# Patient Record
Sex: Male | Born: 1960 | Race: White | Hispanic: No | Marital: Married | Smoking: Never smoker
Health system: Southern US, Community
[De-identification: ages and names within clinical notes are randomized; demographics above are authoritative.]

## PROBLEM LIST (undated history)

## (undated) DIAGNOSIS — I469 Cardiac arrest, cause unspecified: Secondary | ICD-10-CM

## (undated) DIAGNOSIS — G43909 Migraine, unspecified, not intractable, without status migrainosus: Secondary | ICD-10-CM

## (undated) DIAGNOSIS — I10 Essential (primary) hypertension: Secondary | ICD-10-CM

## (undated) DIAGNOSIS — E119 Type 2 diabetes mellitus without complications: Secondary | ICD-10-CM

---

## 2004-01-10 ENCOUNTER — Other Ambulatory Visit: Payer: Self-pay

## 2004-01-11 ENCOUNTER — Other Ambulatory Visit: Payer: Self-pay

## 2004-01-12 ENCOUNTER — Other Ambulatory Visit: Payer: Self-pay

## 2004-01-13 ENCOUNTER — Other Ambulatory Visit: Payer: Self-pay

## 2004-07-20 ENCOUNTER — Inpatient Hospital Stay: Payer: Self-pay | Admitting: Internal Medicine

## 2006-06-08 ENCOUNTER — Emergency Department: Payer: Self-pay | Admitting: Emergency Medicine

## 2006-06-08 ENCOUNTER — Other Ambulatory Visit: Payer: Self-pay

## 2006-06-09 ENCOUNTER — Ambulatory Visit: Payer: Self-pay | Admitting: Emergency Medicine

## 2007-02-07 ENCOUNTER — Emergency Department: Payer: Self-pay | Admitting: Emergency Medicine

## 2008-10-08 ENCOUNTER — Emergency Department: Payer: Self-pay | Admitting: Emergency Medicine

## 2009-10-28 ENCOUNTER — Emergency Department: Payer: Self-pay | Admitting: Emergency Medicine

## 2010-11-12 ENCOUNTER — Emergency Department: Payer: Self-pay | Admitting: Emergency Medicine

## 2011-04-08 ENCOUNTER — Emergency Department: Payer: Self-pay | Admitting: *Deleted

## 2016-03-04 ENCOUNTER — Encounter: Payer: Self-pay | Admitting: Emergency Medicine

## 2016-03-04 ENCOUNTER — Emergency Department
Admission: EM | Admit: 2016-03-04 | Discharge: 2016-03-04 | Disposition: A | Discharge status: Home / Self Care | Payer: Medicare Other | Attending: Emergency Medicine | Admitting: Emergency Medicine

## 2016-03-04 DIAGNOSIS — R739 Hyperglycemia, unspecified: Secondary | ICD-10-CM

## 2016-03-04 DIAGNOSIS — I10 Essential (primary) hypertension: Secondary | ICD-10-CM | POA: Insufficient documentation

## 2016-03-04 DIAGNOSIS — Z79899 Other long term (current) drug therapy: Secondary | ICD-10-CM | POA: Insufficient documentation

## 2016-03-04 DIAGNOSIS — E1165 Type 2 diabetes mellitus with hyperglycemia: Secondary | ICD-10-CM | POA: Insufficient documentation

## 2016-03-04 DIAGNOSIS — Z7982 Long term (current) use of aspirin: Secondary | ICD-10-CM | POA: Diagnosis not present

## 2016-03-04 DIAGNOSIS — Z7984 Long term (current) use of oral hypoglycemic drugs: Secondary | ICD-10-CM | POA: Diagnosis not present

## 2016-03-04 DIAGNOSIS — Z791 Long term (current) use of non-steroidal anti-inflammatories (NSAID): Secondary | ICD-10-CM | POA: Diagnosis not present

## 2016-03-04 DIAGNOSIS — R55 Syncope and collapse: Secondary | ICD-10-CM | POA: Diagnosis not present

## 2016-03-04 HISTORY — DX: Type 2 diabetes mellitus without complications: E11.9

## 2016-03-04 HISTORY — DX: Essential (primary) hypertension: I10

## 2016-03-04 HISTORY — DX: Cardiac arrest, cause unspecified: I46.9

## 2016-03-04 HISTORY — DX: Migraine, unspecified, not intractable, without status migrainosus: G43.909

## 2016-03-04 LAB — BLOOD GAS, VENOUS
ACID-BASE EXCESS: 0.2 mmol/L (ref 0.0–2.0)
Bicarbonate: 24.7 mmol/L (ref 20.0–28.0)
O2 SAT: 92.9 %
PATIENT TEMPERATURE: 37
PCO2 VEN: 39 mmHg — AB (ref 44.0–60.0)
pH, Ven: 7.41 (ref 7.250–7.430)
pO2, Ven: 66 mmHg — ABNORMAL HIGH (ref 32.0–45.0)

## 2016-03-04 LAB — COMPREHENSIVE METABOLIC PANEL
ALT: 36 U/L (ref 17–63)
ANION GAP: 10 (ref 5–15)
AST: 37 U/L (ref 15–41)
Albumin: 3.8 g/dL (ref 3.5–5.0)
Alkaline Phosphatase: 77 U/L (ref 38–126)
BILIRUBIN TOTAL: 0.5 mg/dL (ref 0.3–1.2)
BUN: 11 mg/dL (ref 6–20)
CHLORIDE: 98 mmol/L — AB (ref 101–111)
CO2: 23 mmol/L (ref 22–32)
Calcium: 8.7 mg/dL — ABNORMAL LOW (ref 8.9–10.3)
Creatinine, Ser: 0.85 mg/dL (ref 0.61–1.24)
GFR calc Af Amer: >60 mL/min (ref >60)
Glucose, Bld: 536 mg/dL (ref 65–99)
POTASSIUM: 3.8 mmol/L (ref 3.5–5.1)
Sodium: 131 mmol/L — ABNORMAL LOW (ref 135–145)
TOTAL PROTEIN: 7.7 g/dL (ref 6.5–8.1)

## 2016-03-04 LAB — GLUCOSE, CAPILLARY
GLUCOSE-CAPILLARY: 525 mg/dL — AB (ref 65–99)
Glucose-Capillary: 498 mg/dL — ABNORMAL HIGH (ref 65–99)
Glucose-Capillary: 261 mg/dL — ABNORMAL HIGH (ref 65–99)

## 2016-03-04 LAB — CBC WITH DIFFERENTIAL/PLATELET
BASOS ABS: 0.1 10*3/uL (ref 0–0.1)
BASOS PCT: 1 %
EOS PCT: 1 %
Eosinophils Absolute: 0.1 10*3/uL (ref 0–0.7)
HEMATOCRIT: 44.2 % (ref 40.0–52.0)
Hemoglobin: 15.4 g/dL (ref 13.0–18.0)
LYMPHS PCT: 20 %
Lymphs Abs: 1.3 10*3/uL (ref 1.0–3.6)
MCH: 31.5 pg (ref 26.0–34.0)
MCHC: 34.8 g/dL (ref 32.0–36.0)
MCV: 90.4 fL (ref 80.0–100.0)
MONO ABS: 0.6 10*3/uL (ref 0.2–1.0)
MONOS PCT: 9 %
NEUTROS ABS: 4.5 10*3/uL (ref 1.4–6.5)
Neutrophils Relative %: 69 %
PLATELETS: 222 10*3/uL (ref 150–440)
RBC: 4.89 MIL/uL (ref 4.40–5.90)
RDW: 12.6 % (ref 11.5–14.5)
WBC: 6.5 10*3/uL (ref 3.8–10.6)

## 2016-03-04 LAB — URINALYSIS COMPLETE WITH MICROSCOPIC (ARMC ONLY)
BILIRUBIN URINE: NEGATIVE
Bacteria, UA: NONE SEEN
HGB URINE DIPSTICK: NEGATIVE
KETONES UR: NEGATIVE mg/dL
LEUKOCYTES UA: NEGATIVE
NITRITE: NEGATIVE
PH: 5 (ref 5.0–8.0)
Protein, ur: NEGATIVE mg/dL
SPECIFIC GRAVITY, URINE: 1.026 (ref 1.005–1.030)

## 2016-03-04 LAB — BETA-HYDROXYBUTYRIC ACID: Beta-Hydroxybutyric Acid: 0.15 mmol/L (ref 0.05–0.27)

## 2016-03-04 LAB — LACTIC ACID, PLASMA: LACTIC ACID, VENOUS: 1.9 mmol/L (ref 0.5–1.9)

## 2016-03-04 LAB — TROPONIN I

## 2016-03-04 MED ORDER — INSULIN ASPART 100 UNIT/ML ~~LOC~~ SOLN
10.0000 [IU] | Freq: Once | SUBCUTANEOUS | Status: AC
  Administered 2016-03-04: 10 [IU] via INTRAVENOUS
  Filled 2016-03-04: qty 10

## 2016-03-04 MED ORDER — SODIUM CHLORIDE 0.9 % IV SOLN
1000.0000 mL | Freq: Once | INTRAVENOUS | Status: AC
  Administered 2016-03-04: 1000 mL via INTRAVENOUS

## 2016-03-04 MED ORDER — KETOROLAC TROMETHAMINE 30 MG/ML IJ SOLN
30.0000 mg | Freq: Once | INTRAMUSCULAR | Status: AC
  Administered 2016-03-04: 30 mg via INTRAVENOUS
  Filled 2016-03-04: qty 1

## 2016-03-04 MED ORDER — ONDANSETRON HCL 4 MG/2ML IJ SOLN
4.0000 mg | Freq: Once | INTRAMUSCULAR | Status: AC
  Administered 2016-03-04: 4 mg via INTRAVENOUS
  Filled 2016-03-04: qty 2

## 2016-03-04 MED ORDER — INSULIN GLARGINE 100 UNIT/ML ~~LOC~~ SOLN
40.0000 [IU] | Freq: Once | SUBCUTANEOUS | Status: AC
Start: 2016-03-04 — End: 2016-03-04
  Administered 2016-03-04: 40 [IU] via SUBCUTANEOUS
  Filled 2016-03-04: qty 0.4

## 2016-03-04 MED ORDER — INSULIN ASPART 100 UNIT/ML ~~LOC~~ SOLN
10.0000 [IU] | Freq: Once | SUBCUTANEOUS | Status: AC
  Administered 2016-03-04: 10 [IU] via SUBCUTANEOUS
  Filled 2016-03-04: qty 10

## 2016-03-04 MED ORDER — INSULIN GLARGINE 100 UNIT/ML ~~LOC~~ SOLN: SUBCUTANEOUS | 3 refills | Status: AC

## 2016-03-04 MED ORDER — SODIUM CHLORIDE 0.9 % IV SOLN
Freq: Once | INTRAVENOUS | Status: AC
  Administered 2016-03-04: 12:00:00 via INTRAVENOUS

## 2016-03-04 NOTE — ED Triage Notes (Signed)
Pt arrived via EMS from home. Pt states he was taking his son to school and doesn't remember leaving. EMS states wife reports pt was not acting right at home. [facility] at [facility] for DKA and cardiac arrest.  Pt states he has minimal use of left arm.  EMS reports CBG was 503.  Pt is alert and talking.

## 2016-03-04 NOTE — ED Provider Notes (Signed)
[facility] Emergency Department Provider Note        Time seen: ----------------------------------------- 9:43 AM on 03/04/2016 -----------------------------------------    I have reviewed the triage vital signs and the nursing notes.   HISTORY  Chief Complaint Hyperglycemia    HPI Craig HAREWOOD is a 55 y.o. male who presents to ER being brought from home by EMS for near syncopal event. Patient states he was taking some school doesn't remember leaving. EMS states his wife reports she's not been acting correctly at home. Was recently [facility] for DKA and supposedly had cardiac arrest. Patient states his blood sugar has been markedly elevated, the lowest it has been is in the mid 200s. He arrives awake and alert, denies complaints just feels weak.   No past medical history on file.  There are no active problems to display for this patient.   No past surgical history on file.  Allergies Review of patient's allergies indicates not on file.  Social History Social History  Substance Use Topics  . Smoking status: Not on file  . Smokeless tobacco: Not on file  . Alcohol use Not on file    Review of Systems Constitutional: Negative for fever. Cardiovascular: Negative for chest pain. Respiratory: Negative for shortness of breath. Gastrointestinal: Negative for abdominal pain, vomiting and diarrhea. Genitourinary: Negative for dysuria. Musculoskeletal: Negative for back pain. Skin: Negative for rash. Neurological: Negative for headaches, Positive for weakness  10-point ROS otherwise negative.  ____________________________________________   PHYSICAL EXAM:  VITAL SIGNS: ED Triage Vitals  Enc Vitals Group     BP      Pulse      Resp      Temp      Temp src      SpO2      Weight      Height      Head Circumference      Peak Flow      Pain Score      Pain Loc      Pain Edu?      Excl. in GC?     Constitutional: Alert and oriented. No  acute distress Eyes: Conjunctivae are normal. PERRL. Normal extraocular movements. ENT   Head: Normocephalic and atraumatic.   Nose: No congestion/rhinnorhea.   Mouth/Throat: Mucous membranes are moist.   Neck: No stridor. Cardiovascular: Normal rate, regular rhythm. No murmurs, rubs, or gallops. Respiratory: Normal respiratory effort without tachypnea nor retractions. Breath sounds are clear and equal bilaterally. No wheezes/rales/rhonchi. Gastrointestinal: Soft and nontender. Normal bowel sounds Musculoskeletal: Nontender with normal range of motion in all extremities. No lower extremity tenderness nor edema. Neurologic:  Normal speech and language. No gross focal neurologic deficits are appreciated. 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 No rash noted. Psychiatric: Mood and affect are normal. Speech and behavior are normal.  ____________________________________________  EKG: Interpreted by me. Sinus rhythm with rate 88 bpm, normal PR interval, normal QRS width, normal QT interval. Normal axis, PVC, low voltage.  ____________________________________________  ED COURSE:  Pertinent labs & imaging results that were available during my care of the patient were reviewed by me and considered in my medical decision making (see chart for details). Clinical Course  Patient presents to ER with hyperglycemia. We will assess with basic labs, give IV fluid and reevaluate.  Procedures ____________________________________________   LABS (pertinent positives/negatives)  Labs Reviewed  COMPREHENSIVE METABOLIC PANEL - Abnormal; Notable for the  following:       Result Value   Sodium 131 (*)    Chloride 98 (*)    Glucose, Bld 536 (*)    Calcium 8.7 (*)    All other components within normal limits  URINALYSIS COMPLETEWITH MICROSCOPIC ([facility] ONLY) - Abnormal; Notable for the following:    Color, Urine STRAW (*)     APPearance CLEAR (*)    Glucose, UA >500 (*)    Squamous Epithelial / LPF 0-5 (*)    All other components within normal limits  BLOOD GAS, VENOUS - Abnormal; Notable for the following:    pCO2, Ven 39 (*)    pO2, Ven 66.0 (*)    All other components within normal limits  GLUCOSE, CAPILLARY - Abnormal; Notable for the following:    Glucose-Capillary 498 (*)    All other components within normal limits  GLUCOSE, CAPILLARY - Abnormal; Notable for the following:    Glucose-Capillary 525 (*)    All other components within normal limits  CBC WITH DIFFERENTIAL/PLATELET  LACTIC ACID, PLASMA  TROPONIN I  BETA-HYDROXYBUTYRIC ACID  HEMOGLOBIN A1C  CBG MONITORING, ED  CBG MONITORING, ED  ____________________________________________  FINAL ASSESSMENT AND PLAN  Hyperglycemia, Near syncope  Plan: Patient with labs and imaging as dictated above. Patient is hyperglycemic but not in DKA. I discussed with the [facility] endocrinology recommends 40 units of Lantus which we have started him on here. His next dose will be tomorrow night. He does have close outpatient follow-up with his doctor. He is stable for outpatient follow-up at this time. Also to note, he had a panic attack while in the room, this resolved with reassurance.   Emily FilbertWilliams, Clanton Emanuelson E, MD   Note: This dictation was prepared with Dragon dictation. Any transcriptional errors that result from this process are unintentional    Emily FilbertJonathan E Imanuel Pruiett, MD 03/04/16 1308

## 2016-03-04 NOTE — ED Notes (Signed)
The EKG was completed, signed by Dr. Derrill KayGoodman, and exported into the system.

## 2016-03-04 NOTE — ED Notes (Signed)
Family out to hallway asking for help that something is wrong with the patient.  Pt observed in bed shaking but not in a seizure. Pt is able to talk through it.  MD at bedside, explained to family and patient that he is having an anxiety attack and not a seizure. Pt was able to relax with reassurance by myself and the MD.  Pt given a warm blanket as well.

## 2016-03-05 LAB — HEMOGLOBIN A1C: HEMOGLOBIN A1C: 11.4 % — AB (ref 4.0–6.0)

## 2017-02-27 ENCOUNTER — Emergency Department
Admission: EM | Admit: 2017-02-27 | Discharge: 2017-02-27 | Disposition: A | Discharge status: Home / Self Care | Payer: Medicare Other | Attending: Emergency Medicine | Admitting: Emergency Medicine

## 2017-02-27 ENCOUNTER — Emergency Department: Payer: Medicare Other

## 2017-02-27 DIAGNOSIS — W010XXA Fall on same level from slipping, tripping and stumbling without subsequent striking against object, initial encounter: Secondary | ICD-10-CM | POA: Diagnosis not present

## 2017-02-27 DIAGNOSIS — Y999 Unspecified external cause status: Secondary | ICD-10-CM | POA: Diagnosis not present

## 2017-02-27 DIAGNOSIS — Z79899 Other long term (current) drug therapy: Secondary | ICD-10-CM | POA: Diagnosis not present

## 2017-02-27 DIAGNOSIS — Z794 Long term (current) use of insulin: Secondary | ICD-10-CM | POA: Insufficient documentation

## 2017-02-27 DIAGNOSIS — Y9301 Activity, walking, marching and hiking: Secondary | ICD-10-CM | POA: Insufficient documentation

## 2017-02-27 DIAGNOSIS — S60222A Contusion of left hand, initial encounter: Secondary | ICD-10-CM | POA: Diagnosis not present

## 2017-02-27 DIAGNOSIS — E119 Type 2 diabetes mellitus without complications: Secondary | ICD-10-CM | POA: Insufficient documentation

## 2017-02-27 DIAGNOSIS — Z7982 Long term (current) use of aspirin: Secondary | ICD-10-CM | POA: Insufficient documentation

## 2017-02-27 DIAGNOSIS — Y9289 Other specified places as the place of occurrence of the external cause: Secondary | ICD-10-CM | POA: Insufficient documentation

## 2017-02-27 DIAGNOSIS — S60922A Unspecified superficial injury of left hand, initial encounter: Secondary | ICD-10-CM | POA: Diagnosis present

## 2017-02-27 DIAGNOSIS — I10 Essential (primary) hypertension: Secondary | ICD-10-CM | POA: Diagnosis not present

## 2017-02-27 MED ORDER — HYDROCODONE-ACETAMINOPHEN 5-325 MG PO TABS: 1.0000 | ORAL_TABLET | Freq: Four times a day (QID) | ORAL | 0 refills | Status: DC | PRN

## 2017-02-27 NOTE — ED Triage Notes (Signed)
Pt states he tripped and fell today and is having left hand pain.

## 2017-02-27 NOTE — Discharge Instructions (Signed)
Follow-up with your doctor at [facility] if any continued problems or [facility]. Ice and elevate your hand today. Leave the wrap on for added support. Norco every 6 hours as needed for pain.

## 2017-02-27 NOTE — ED Provider Notes (Signed)
[facility] Emergency Department Provider Note  ____________________________________________   First MD Initiated Contact with Patient 02/27/17 1015     (approximate)  I have reviewed the triage vital signs and the nursing notes.   HISTORY  Chief Complaint Hand Pain   HPI Craig Sharp is a 56 y.o. male is here complaining of left hand pain. Patient states that he has vertigo and was walking across the yard when he fell injuring his hand. Patient has increased pain with any movement of his left thumb. Patient denies any head injury or loss of consciousness. He rates pain as an 8/10. He was able to drive himself to the emergency department.   Past Medical History:  Diagnosis Date  . Cardiac arrest (HCC)   . Diabetes mellitus without complication (HCC)   . Hypertension   . Migraine     There are no active problems to display for this patient.   History reviewed. No pertinent surgical history.  Prior to Admission medications   Medication Sig Start Date End Date Taking? Authorizing Provider  aspirin EC 81 MG tablet Take 81 mg by mouth daily.    [provider]  atorvastatin (LIPITOR) 40 MG tablet Take 40 mg by mouth at bedtime.    [provider]  gabapentin (NEURONTIN) 600 MG tablet Take 1,200 mg by mouth 3 (three) times daily.    [provider]  glipiZIDE (GLUCOTROL) 10 MG tablet Take 10 mg by mouth daily.    [provider]  HYDROcodone-acetaminophen (NORCO/VICODIN) 5-325 MG tablet Take 1 tablet by mouth every 6 (six) hours as needed for moderate pain. 02/27/17   Tommi Rumps, PA-C  ibuprofen (ADVIL,MOTRIN) 600 MG tablet Take 600 mg by mouth every 6 (six) hours as needed for moderate pain.     [provider]  insulin glargine (LANTUS) 100 UNIT/ML injection 40 units subcutaneous daily at bedtime 03/04/16 03/04/17  Emily Filbert, MD  linagliptin (TRADJENTA) 5 MG TABS tablet Take 5 mg by mouth  daily.    [provider]  lisinopril (PRINIVIL,ZESTRIL) 2.5 MG tablet Take 5 mg by mouth daily.    [provider]  metFORMIN (GLUCOPHAGE) 1000 MG tablet Take 1,000 mg by mouth 2 (two) times daily with a meal.    [provider]  mirabegron ER (MYRBETRIQ) 50 MG TB24 tablet Take 50 mg by mouth daily.    [provider]  omeprazole (PRILOSEC) 20 MG capsule Take 20 mg by mouth daily.    [provider]  promethazine (PHENERGAN) 25 MG tablet Take 25 mg by mouth every 8 (eight) hours as needed for nausea.     [provider]  sertraline (ZOLOFT) 100 MG tablet Take 200 mg by mouth daily.    [provider]  SUMAtriptan (IMITREX) 25 MG tablet Take 50 mg by mouth every 2 (two) hours as needed for migraine.     [provider]  tamsulosin (FLOMAX) 0.4 MG CAPS capsule Take 0.4 mg by mouth daily.    [provider]  traMADol (ULTRAM) 50 MG tablet Take 50 mg by mouth every 8 (eight) hours as needed for moderate pain or severe pain.     [provider]  traZODone (DESYREL) 50 MG tablet Take 25 mg by mouth 3 (three) times daily as needed. For anxiety    [provider]  traZODone (DESYREL) 50 MG tablet Take 100 mg by mouth at bedtime as needed for sleep.    [provider]    Allergies Patient has no known allergies.  No family history on file.  Social History Social History  Substance Use Topics  . Smoking status: Never Smoker  . Smokeless tobacco: Never Used  . Alcohol use No    Review of Systems Constitutional: No fever/chills Eyes: No visual changes. ENT: No trauma Cardiovascular: Denies chest pain. Respiratory: Denies shortness of breath. Musculoskeletal: Positive for left hand pain. Skin: Negative for rash. Neurological: Negative for focal weakness or numbness.   ____________________________________________   PHYSICAL EXAM:  VITAL SIGNS: ED Triage Vitals  Enc Vitals Group       BP 02/27/17 0951 (!) 141/97     Pulse Rate 02/27/17 0951 92     Resp 02/27/17 0951 16     Temp 02/27/17 0951 98.6 F (37 C)     Temp Source 02/27/17 0951 Oral     SpO2 02/27/17 0951 99 %     Weight 02/27/17 0953 263 lb (119.3 kg)     Height 02/27/17 0953 5\' 11"  (1.803 m)     Head Circumference --      Peak Flow --      Pain Score 02/27/17 0939 8     Pain Loc --      Pain Edu? --      Excl. in GC? --    Constitutional: Alert and oriented. Well appearing and in no acute distress. Eyes: Conjunctivae are normal. Head: Atraumatic. Nose: No trauma Neck: No stridor.   Cardiovascular: Normal rate, regular rhythm. Grossly normal heart sounds.  Good peripheral circulation. Respiratory: Normal respiratory effort.  No retractions. Lungs CTAB. Musculoskeletal: Left hand no gross deformity is noted. There is some soft tissue swelling on the left hyperthenar eminence and increased pain with range of motion of left thumb. There is no abrasions or ecchymosis noted. Capillary refill is less than 3 seconds. Motor sensory is intact. Patient is able move wrist without any difficulty or pain. Neurologic:  Normal speech and language. No gross focal neurologic deficits are appreciated.  Skin:  Skin is warm, dry and intact.  Psychiatric: Mood and affect are normal. Speech and behavior are normal.  ____________________________________________   LABS (all labs ordered are listed, but only abnormal results are displayed)  Labs Reviewed - No data to display  RADIOLOGY  Dg Hand Complete Left  Result Date: 02/27/2017 CLINICAL DATA:  Left hand injury.  Pain. EXAM: LEFT HAND - COMPLETE 3+ VIEW COMPARISON:  None. FINDINGS: There is no evidence of fracture or dislocation. There is no evidence of arthropathy or other focal bone abnormality. The soft tissues are unremarkable. There is peripheral vascular atherosclerotic disease. IMPRESSION: No acute osseous injury of the left hand. Electronically Signed   By:  Elige Ko   On: 02/27/2017 10:42    ____________________________________________   PROCEDURES  Procedure(s) performed: None  Procedures  Critical Care performed: No  ____________________________________________   INITIAL IMPRESSION / ASSESSMENT AND PLAN / ED COURSE  Pertinent labs & imaging results that were available during my care of the patient were reviewed by me and considered in my medical decision making (see chart for details).  Patient was instructed to ice and elevate his hand to reduce swelling and he was placed in a Jones wrap for a stress support and protection. He is given a prescription for Norco as needed for pain. He will follow-up with his PCP at [facility] if any continued problems or he can follow up with the walk-in clinic at  [facility].   ___________________________________________   FINAL CLINICAL IMPRESSION(S) / ED DIAGNOSES  Final diagnoses:  Contusion of left hand, initial encounter      NEW MEDICATIONS STARTED DURING THIS VISIT:  New Prescriptions   HYDROCODONE-ACETAMINOPHEN (NORCO/VICODIN) 5-325 MG TABLET    Take 1 tablet by mouth every 6 (six) hours as needed for moderate pain.     Note:  This document was prepared using Dragon voice recognition software and may include unintentional dictation errors.    Tommi Rumps, PA-C 02/27/17 1112    Minna Antis, MD 02/27/17 1353

## 2017-02-27 NOTE — ED Notes (Signed)
"  jones splint" and padding applied to L hand to cushion and immobilize L thumb.

## 2017-02-27 NOTE — ED Notes (Signed)
See triage note  States he fell  Having pain to left hand  Positive swelling and increased pain

## 2018-08-08 ENCOUNTER — Emergency Department: Payer: Medicare Other

## 2018-08-08 ENCOUNTER — Encounter: Payer: Self-pay | Admitting: Emergency Medicine

## 2018-08-08 ENCOUNTER — Other Ambulatory Visit: Payer: Self-pay

## 2018-08-08 ENCOUNTER — Emergency Department
Admission: EM | Admit: 2018-08-08 | Discharge: 2018-08-08 | Disposition: A | Discharge status: Home / Self Care | Payer: Medicare Other | Attending: Emergency Medicine | Admitting: Emergency Medicine

## 2018-08-08 DIAGNOSIS — Z79899 Other long term (current) drug therapy: Secondary | ICD-10-CM | POA: Insufficient documentation

## 2018-08-08 DIAGNOSIS — Z794 Long term (current) use of insulin: Secondary | ICD-10-CM | POA: Insufficient documentation

## 2018-08-08 DIAGNOSIS — E119 Type 2 diabetes mellitus without complications: Secondary | ICD-10-CM | POA: Insufficient documentation

## 2018-08-08 DIAGNOSIS — R110 Nausea: Secondary | ICD-10-CM | POA: Diagnosis not present

## 2018-08-08 DIAGNOSIS — I252 Old myocardial infarction: Secondary | ICD-10-CM | POA: Diagnosis not present

## 2018-08-08 DIAGNOSIS — Z7982 Long term (current) use of aspirin: Secondary | ICD-10-CM | POA: Diagnosis not present

## 2018-08-08 DIAGNOSIS — I10 Essential (primary) hypertension: Secondary | ICD-10-CM | POA: Insufficient documentation

## 2018-08-08 DIAGNOSIS — R1031 Right lower quadrant pain: Secondary | ICD-10-CM | POA: Insufficient documentation

## 2018-08-08 DIAGNOSIS — R1084 Generalized abdominal pain: Secondary | ICD-10-CM

## 2018-08-08 LAB — CBC WITH DIFFERENTIAL/PLATELET
Abs Immature Granulocytes: 0.02 10*3/uL (ref 0.00–0.07)
BASOS ABS: 0.1 10*3/uL (ref 0.0–0.1)
BASOS PCT: 1 %
EOS ABS: 0.7 10*3/uL — AB (ref 0.0–0.5)
Eosinophils Relative: 11 %
HCT: 45.8 % (ref 39.0–52.0)
Hemoglobin: 16.1 g/dL (ref 13.0–17.0)
IMMATURE GRANULOCYTES: 0 %
LYMPHS ABS: 1.1 10*3/uL (ref 0.7–4.0)
Lymphocytes Relative: 15 %
MCH: 31.3 pg (ref 26.0–34.0)
MCHC: 35.2 g/dL (ref 30.0–36.0)
MCV: 89.1 fL (ref 80.0–100.0)
Monocytes Absolute: 1 10*3/uL (ref 0.1–1.0)
Monocytes Relative: 14 %
NEUTROS PCT: 59 %
NRBC: 0 % (ref 0.0–0.2)
Neutro Abs: 4.1 10*3/uL (ref 1.7–7.7)
PLATELETS: 290 10*3/uL (ref 150–400)
RBC: 5.14 MIL/uL (ref 4.22–5.81)
RDW: 12.4 % (ref 11.5–15.5)
WBC: 6.9 10*3/uL (ref 4.0–10.5)

## 2018-08-08 LAB — COMPREHENSIVE METABOLIC PANEL
ALBUMIN: 4.3 g/dL (ref 3.5–5.0)
ALT: 33 U/L (ref 0–44)
AST: 28 U/L (ref 15–41)
Alkaline Phosphatase: 57 U/L (ref 38–126)
Anion gap: 11 (ref 5–15)
BUN: 7 mg/dL (ref 6–20)
CHLORIDE: 105 mmol/L (ref 98–111)
CO2: 20 mmol/L — ABNORMAL LOW (ref 22–32)
Calcium: 9.2 mg/dL (ref 8.9–10.3)
Creatinine, Ser: 0.87 mg/dL (ref 0.61–1.24)
GFR calc Af Amer: >60 mL/min (ref >60)
GFR calc non Af Amer: >60 mL/min (ref >60)
GLUCOSE: 152 mg/dL — AB (ref 70–99)
POTASSIUM: 3.7 mmol/L (ref 3.5–5.1)
SODIUM: 136 mmol/L (ref 135–145)
Total Bilirubin: 0.9 mg/dL (ref 0.3–1.2)
Total Protein: 8.2 g/dL — ABNORMAL HIGH (ref 6.5–8.1)

## 2018-08-08 LAB — LIPASE, BLOOD: Lipase: 30 U/L (ref 11–51)

## 2018-08-08 MED ORDER — IOHEXOL 350 MG/ML SOLN
100.0000 mL | Freq: Once | INTRAVENOUS | Status: AC | PRN
  Administered 2018-08-08: 100 mL via INTRAVENOUS

## 2018-08-08 MED ORDER — HALOPERIDOL LACTATE 5 MG/ML IJ SOLN
5.0000 mg | Freq: Once | INTRAMUSCULAR | Status: AC
  Administered 2018-08-08: 5 mg via INTRAVENOUS
  Filled 2018-08-08: qty 1

## 2018-08-08 MED ORDER — SODIUM CHLORIDE 0.9 % IV BOLUS
1000.0000 mL | Freq: Once | INTRAVENOUS | Status: AC
  Administered 2018-08-08: 1000 mL via INTRAVENOUS

## 2018-08-08 MED ORDER — ONDANSETRON HCL 4 MG/2ML IJ SOLN
4.0000 mg | Freq: Once | INTRAMUSCULAR | Status: AC
  Administered 2018-08-08: 4 mg via INTRAVENOUS
  Filled 2018-08-08: qty 2

## 2018-08-08 MED ORDER — MORPHINE SULFATE (PF) 4 MG/ML IV SOLN
4.0000 mg | Freq: Once | INTRAVENOUS | Status: AC
  Administered 2018-08-08: 4 mg via INTRAVENOUS
  Filled 2018-08-08: qty 1

## 2018-08-08 MED ORDER — DICYCLOMINE HCL 20 MG PO TABS: 20.0000 mg | ORAL_TABLET | Freq: Three times a day (TID) | ORAL | 0 refills | Status: AC | PRN

## 2018-08-08 NOTE — Discharge Instructions (Addendum)
Please seek medical attention for any high fevers, chest pain, shortness of breath, change in behavior, persistent vomiting, bloody stool or any other new or concerning symptoms.  

## 2018-08-08 NOTE — ED Triage Notes (Addendum)
Pt arrives to Ed via [facility]. Per EMS pt has had abd pain for the last couple of weeks. Abd Pain 10/10 RLQ radiates to R/lower back. Per EMS pt takes insulin at home, Pt states he has not taking his insulin today. EMA: CBG 143, 160/88; 92HR; pt was nauseous, EMS administered 4mg  of zofran in route. Upon arrival pt c/o of dizziness, nausea. Pt states severe pain after eating. Pt is actively  dry heaving.

## 2018-08-08 NOTE — ED Notes (Signed)
ED Provider at bedside. 

## 2018-08-08 NOTE — ED Provider Notes (Signed)
[facility] Emergency Department Provider Note  ____________________________________________   I have reviewed the triage vital signs and the nursing notes.   HISTORY  Chief Complaint Abdominal Pain   History limited by: Not Limited   HPI Craig Sharp is a 58 y.o. male who presents to the emergency department today because of concerns for abdominal pain.  Patient apparently has been battling abdominal pain for a few weeks.  Is located on the right side.  He states it is worse after he eats.  Today he had a sandwich and then the pain became severe.  Is located primarily in the right lower quadrant.  He has had some nausea with this.  He is also noticed that his stool is now more green in color.  He has not noticed any blood in his stool.  Patient denies any fevers.  There is strong family history of gallbladder disease.  Per medical record review patient has a history of cardiac arrest, DM, HTN.   Past Medical History:  Diagnosis Date  . Cardiac arrest (HCC)   . Diabetes mellitus without complication (HCC)   . Hypertension   . Migraine     There are no active problems to display for this patient.   History reviewed. No pertinent surgical history.  Prior to Admission medications   Medication Sig Start Date End Date Taking? Authorizing Provider  aspirin EC 81 MG tablet Take 81 mg by mouth daily.    [provider]  atorvastatin (LIPITOR) 40 MG tablet Take 40 mg by mouth at bedtime.    [provider]  gabapentin (NEURONTIN) 600 MG tablet Take 1,200 mg by mouth 3 (three) times daily.    [provider]  glipiZIDE (GLUCOTROL) 10 MG tablet Take 10 mg by mouth daily.    [provider]  HYDROcodone-acetaminophen (NORCO/VICODIN) 5-325 MG tablet Take 1 tablet by mouth every 6 (six) hours as needed for moderate pain. 02/27/17   Tommi Rumps, PA-C  ibuprofen (ADVIL,MOTRIN) 600 MG tablet Take 600 mg by mouth every 6 (six)  hours as needed for moderate pain.     [provider]  insulin glargine (LANTUS) 100 UNIT/ML injection 40 units subcutaneous daily at bedtime 03/04/16 03/04/17  Emily Filbert, MD  linagliptin (TRADJENTA) 5 MG TABS tablet Take 5 mg by mouth daily.    [provider]  lisinopril (PRINIVIL,ZESTRIL) 2.5 MG tablet Take 5 mg by mouth daily.    [provider]  metFORMIN (GLUCOPHAGE) 1000 MG tablet Take 1,000 mg by mouth 2 (two) times daily with a meal.    [provider]  mirabegron ER (MYRBETRIQ) 50 MG TB24 tablet Take 50 mg by mouth daily.    [provider]  omeprazole (PRILOSEC) 20 MG capsule Take 20 mg by mouth daily.    [provider]  promethazine (PHENERGAN) 25 MG tablet Take 25 mg by mouth every 8 (eight) hours as needed for nausea.     [provider]  sertraline (ZOLOFT) 100 MG tablet Take 200 mg by mouth daily.    [provider]  SUMAtriptan (IMITREX) 25 MG tablet Take 50 mg by mouth every 2 (two) hours as needed for migraine.     [provider]  tamsulosin (FLOMAX) 0.4 MG CAPS capsule Take 0.4 mg by mouth daily.    [provider]  traMADol (ULTRAM) 50 MG tablet Take 50 mg by mouth every 8 (eight) hours as needed for moderate pain or severe pain.  [provider]  traZODone (DESYREL) 50 MG tablet Take 25 mg by mouth 3 (three) times daily as needed. For anxiety    [provider]  traZODone (DESYREL) 50 MG tablet Take 100 mg by mouth at bedtime as needed for sleep.    [provider]    Allergies Patient has no known allergies.  History reviewed. No pertinent family history.  Social History Social History   Tobacco Use  . Smoking status: Never Smoker  . Smokeless tobacco: Never Used  Substance Use Topics  . Alcohol use: No  . Drug use: Not on file    Review of Systems Constitutional: No fever/chills Eyes: No visual changes. ENT: No sore  throat. Cardiovascular: Denies chest pain. Respiratory: Denies shortness of breath. Gastrointestinal: Positive for abdominal pain.  Genitourinary: Negative for dysuria. Musculoskeletal: Negative for back pain. Skin: Negative for rash. Neurological: Negative for headaches, focal weakness or numbness.  ____________________________________________   PHYSICAL EXAM:  VITAL SIGNS: ED Triage Vitals  Enc Vitals Group     BP 08/08/18 1856 (!) 150/101     Pulse Rate 08/08/18 1856 91     Resp 08/08/18 1856 20     Temp 08/08/18 1851 99.2 F (37.3 C)     Temp Source 08/08/18 1851 Oral     SpO2 08/08/18 1856 100 %     Weight 08/08/18 1852 260 lb (117.9 kg)     Height 08/08/18 1852 5\' 11"  (1.803 m)     Head Circumference --      Peak Flow --      Pain Score 08/08/18 1852 10    Constitutional: Alert and oriented.  Eyes: Conjunctivae are normal.  ENT      Head: Normocephalic and atraumatic.      Nose: No congestion/rhinnorhea.      Mouth/Throat: Mucous membranes are moist.      Neck: No stridor. Hematological/Lymphatic/Immunilogical: No cervical lymphadenopathy. Cardiovascular: Normal rate, regular rhythm.  No murmurs, rubs, or gallops.  Respiratory: Normal respiratory effort without tachypnea nor retractions. Breath sounds are clear and equal bilaterally. No wheezes/rales/rhonchi. Gastrointestinal: Soft and tender to palpation in the right lower quadrant.  Genitourinary: Deferred Musculoskeletal: Normal range of motion in all extremities. No lower extremity edema. Neurologic:  Normal speech and language. No gross focal neurologic deficits are appreciated.  Skin:  Skin is warm, dry and intact. No rash noted. Psychiatric: Mood and affect are normal. Speech and behavior are normal. Patient exhibits appropriate insight and judgment.  ____________________________________________    LABS (pertinent positives/negatives)  Lipase 30 CBC wbc 6.9, hgb 16.1, plt 290 CMP na 136, k 3.7, glu  152, cr 0.87  ____________________________________________   EKG  None  ____________________________________________    RADIOLOGY  CT abd/pel No acute abnormality  ____________________________________________   PROCEDURES  Procedures  ____________________________________________   INITIAL IMPRESSION / ASSESSMENT AND PLAN / ED COURSE  Pertinent labs & imaging results that were available during my care of the patient were reviewed by me and considered in my medical decision making (see chart for details).   Patient presented to the emergency department today because of concerns for abdominal pain.  He was tender in the right lower quadrant raising concern for possible appendicitis.  Also had some concerns for cholecystitis given the family history.  CT scan did not show any inflammation legs nor did it show any acute abnormality.  Blood work without leukocytosis or elevation of LFTs.  At this point unclear etiology although patient did feel better after  Haldol.  Will discharge with some Bentyl.  Discussed with patient portance of following up with primary care.  ____________________________________________   FINAL CLINICAL IMPRESSION(S) / ED DIAGNOSES  Final diagnoses:  Generalized abdominal pain     Note: This dictation was prepared with Dragon dictation. Any transcriptional errors that result from this process are unintentional     Phineas SemenGoodman, Daton Szilagyi, MD 08/08/18 2136

## 2019-05-04 ENCOUNTER — Emergency Department
Admission: EM | Admit: 2019-05-04 | Discharge: 2019-05-04 | Disposition: A | Discharge status: Home / Self Care | Payer: Medicare Other | Attending: Emergency Medicine | Admitting: Emergency Medicine

## 2019-05-04 ENCOUNTER — Emergency Department: Payer: Medicare Other

## 2019-05-04 ENCOUNTER — Other Ambulatory Visit: Payer: Self-pay

## 2019-05-04 ENCOUNTER — Encounter: Payer: Self-pay | Admitting: Emergency Medicine

## 2019-05-04 DIAGNOSIS — Z7982 Long term (current) use of aspirin: Secondary | ICD-10-CM | POA: Insufficient documentation

## 2019-05-04 DIAGNOSIS — I10 Essential (primary) hypertension: Secondary | ICD-10-CM | POA: Insufficient documentation

## 2019-05-04 DIAGNOSIS — E119 Type 2 diabetes mellitus without complications: Secondary | ICD-10-CM | POA: Insufficient documentation

## 2019-05-04 DIAGNOSIS — M25512 Pain in left shoulder: Secondary | ICD-10-CM | POA: Insufficient documentation

## 2019-05-04 DIAGNOSIS — W19XXXA Unspecified fall, initial encounter: Secondary | ICD-10-CM

## 2019-05-04 DIAGNOSIS — Z794 Long term (current) use of insulin: Secondary | ICD-10-CM | POA: Diagnosis not present

## 2019-05-04 DIAGNOSIS — Z79899 Other long term (current) drug therapy: Secondary | ICD-10-CM | POA: Diagnosis not present

## 2019-05-04 MED ORDER — FENTANYL CITRATE (PF) 100 MCG/2ML IJ SOLN
50.0000 ug | INTRAMUSCULAR | Status: DC | PRN
  Administered 2019-05-04: 50 ug via INTRAVENOUS
  Filled 2019-05-04: qty 2

## 2019-05-04 MED ORDER — LIDOCAINE 5 % EX PTCH: 1.0000 | MEDICATED_PATCH | Freq: Two times a day (BID) | CUTANEOUS | 0 refills | Status: AC

## 2019-05-04 MED ORDER — HYDROMORPHONE HCL 1 MG/ML IJ SOLN
1.0000 mg | Freq: Once | INTRAMUSCULAR | Status: AC
  Administered 2019-05-04: 1 mg via INTRAMUSCULAR

## 2019-05-04 MED ORDER — HYDROMORPHONE HCL 1 MG/ML IJ SOLN
1.0000 mg | Freq: Once | INTRAMUSCULAR | Status: DC
Start: 2019-05-04 — End: 2019-05-04
  Filled 2019-05-04: qty 1

## 2019-05-04 MED ORDER — LIDOCAINE 5 % EX PTCH
1.0000 | MEDICATED_PATCH | CUTANEOUS | Status: DC
  Administered 2019-05-04: 1 via TRANSDERMAL
  Filled 2019-05-04: qty 1

## 2019-05-04 NOTE — ED Triage Notes (Signed)
Pt had episodes of vertigo and lost balance falling with elbow hitting couch and his body kept going.  Seems to have a deformity to LUE but unsure if shoulder or humerus.  Severe pain.

## 2019-05-04 NOTE — ED Notes (Addendum)
First Nurse Note: Pt to ED s/p fall. Pt states that he was getting up off the couch and fell. Pt is c/o pain in his left shoulder. Pt states that he has vertigo.

## 2019-05-04 NOTE — ED Provider Notes (Signed)
[facility] Emergency Department Provider Note   ____________________________________________   First MD Initiated Contact with Patient 05/04/19 1030     (approximate)  I have reviewed the triage vital signs and the nursing notes.   HISTORY  Chief Complaint Shoulder Injury    HPI Craig Sharp is a 58 y.o. male patient presents with left shoulder and elbow pain secondary to a fall.  Patient state lost his balance getting up from a couch and fell striking the left elbow.  Patient points to mid humerus as a source of pain.  Patient denies loss of sensation but has decreased range of motion with extension and flexion of the left upper extremity.  Also decreased range of motion of the left shoulder for abduction.  Patient rates his pain as a 10/10.  Patient described the pain as "sharp".  Patient states transient relief with fentanyl x2.  Patient currently taking Suboxone.         Past Medical History:  Diagnosis Date  . Cardiac arrest ([place])   . Diabetes mellitus without complication ([place])   . Hypertension   . Migraine     There are no active problems to display for this patient.   History reviewed. No pertinent surgical history.  Prior to Admission medications   Medication Sig Start Date End Date Taking? Authorizing Provider  buprenorphine-naloxone (SUBOXONE) 8-2 mg SUBL SL tablet Place 1 tablet under the tongue daily.   Yes [provider]  aspirin EC 81 MG tablet Take 81 mg by mouth daily.    [provider]  atorvastatin (LIPITOR) 40 MG tablet Take 40 mg by mouth at bedtime.    [provider]  dicyclomine (BENTYL) 20 MG tablet Take 1 tablet (20 mg total) by mouth 3 (three) times daily as needed (abdominal pain). 08/08/18   Nance Pear, MD  gabapentin (NEURONTIN) 600 MG tablet Take 1,200 mg by mouth 3 (three) times daily.    [provider]  glipiZIDE (GLUCOTROL) 10 MG tablet Take 10 mg by mouth daily.     [provider]  ibuprofen (ADVIL,MOTRIN) 600 MG tablet Take 600 mg by mouth every 6 (six) hours as needed for moderate pain.     [provider]  insulin glargine (LANTUS) 100 UNIT/ML injection 40 units subcutaneous daily at bedtime 03/04/16 03/04/17  Earleen Newport, MD  lidocaine (LIDODERM) 5 % Place 1 patch onto the skin every 12 (twelve) hours. Remove & Discard patch within 12 hours or as directed by MD 05/04/19 05/03/20  Sable Feil, PA-C  linagliptin (TRADJENTA) 5 MG TABS tablet Take 5 mg by mouth daily.    [provider]  lisinopril (PRINIVIL,ZESTRIL) 2.5 MG tablet Take 5 mg by mouth daily.    [provider]  metFORMIN (GLUCOPHAGE) 1000 MG tablet Take 1,000 mg by mouth 2 (two) times daily with a meal.    [provider]  mirabegron ER (MYRBETRIQ) 50 MG TB24 tablet Take 50 mg by mouth daily.    [provider]  omeprazole (PRILOSEC) 20 MG capsule Take 20 mg by mouth daily.    [provider]  promethazine (PHENERGAN) 25 MG tablet Take 25 mg by mouth every 8 (eight) hours as needed for nausea.     [provider]  sertraline (ZOLOFT) 100 MG tablet Take 200 mg by mouth daily.    [provider]  SUMAtriptan (IMITREX) 25 MG tablet Take 50 mg by mouth every 2 (two) hours as needed  for migraine.     [provider]  tamsulosin (FLOMAX) 0.4 MG CAPS capsule Take 0.4 mg by mouth daily.    [provider]  traZODone (DESYREL) 50 MG tablet Take 25 mg by mouth 3 (three) times daily as needed. For anxiety    [provider]  traZODone (DESYREL) 50 MG tablet Take 100 mg by mouth at bedtime as needed for sleep.    [provider]    Allergies Patient has no known allergies.  History reviewed. No pertinent family history.  Social History Social History   Tobacco Use  . Smoking status: Never Smoker  . Smokeless tobacco: Never Used  Substance Use Topics  . Alcohol use: No  .  Drug use: Not on file    Review of Systems Constitutional: No fever/chills Eyes: No visual changes. ENT: No sore throat. Cardiovascular: Denies chest pain. Respiratory: Denies shortness of breath. Gastrointestinal: No abdominal pain.  No nausea, no vomiting.  No diarrhea.  No constipation. Genitourinary: Negative for dysuria. Musculoskeletal: Left shoulder and upper arm pain. Skin: Negative for rash. Neurological: Negative for headaches, focal weakness or numbness. Endocrine:  Diabetes and hypertension.   ____________________________________________   PHYSICAL EXAM:  VITAL SIGNS: ED Triage Vitals  Enc Vitals Group     BP 05/04/19 0907 (!) 141/87     Pulse Rate 05/04/19 0907 88     Resp 05/04/19 0907 18     Temp 05/04/19 0907 98.3 F (36.8 C)     Temp Source 05/04/19 0907 Oral     SpO2 05/04/19 0907 95 %     Weight 05/04/19 0910 285 lb (129.3 kg)     Height 05/04/19 0910 5\' 11"  (1.803 m)     Head Circumference --      Peak Flow --      Pain Score 05/04/19 0910 10     Pain Loc --      Pain Edu? --      Excl. in GC? --    Constitutional: Alert and oriented. Well appearing and in no acute distress. Cardiovascular: Normal rate, regular rhythm. Grossly normal heart sounds.  Good peripheral circulation. Respiratory: Normal respiratory effort.  No retractions. Lungs CTAB. Musculoskeletal: No lower extremity tenderness nor edema.  No joint effusions. Neurologic:  Normal speech and language. No gross focal neurologic deficits are appreciated. No gait instability. Skin:  Skin is warm, dry and intact. No rash noted. Psychiatric: Mood and affect are normal. Speech and behavior are normal.  ____________________________________________   LABS (all labs ordered are listed, but only abnormal results are displayed)  Labs Reviewed - No data to display ____________________________________________  EKG   ____________________________________________  RADIOLOGY  ED MD  interpretation:    Official radiology report(s): Dg Shoulder Left  Result Date: 05/04/2019 CLINICAL DATA:  Larey SeatFell against the edge of the couch this morning, hyper extending left shoulder. Now, unable to move/use left shoulder Pt refused axillary condition EXAM: LEFT SHOULDER - 2+ VIEW COMPARISON:  None. FINDINGS: No fracture or bone lesion. Glenohumeral joint appears widened, but normally aligned. [facility] with small marginal spurs consistent with mild osteoarthritis. Soft tissues are unremarkable. IMPRESSION: 1. No fracture or dislocation. 2. Widened glenohumeral joint space suggesting ligamentous injury. 3. Mild AC joint osteoarthritis. Electronically Signed   By: Amie Portlandavid  Ormond M.D.   On: 05/04/2019 10:19   Dg Humerus Left  Result Date: 05/04/2019 CLINICAL DATA:  Fall, pain EXAM: LEFT HUMERUS - 2+ VIEW COMPARISON:  None. FINDINGS: No  fracture or dislocation of the left humerus. The included shoulder, elbow, and left chest are unremarkable. Soft tissues are unremarkable. IMPRESSION: No fracture or dislocation of the left humerus. Electronically Signed   By: Lauralyn Primes M.D.   On: 05/04/2019 11:13    ____________________________________________   PROCEDURES  Procedure(s) performed (including Critical Care):  Procedures   ____________________________________________   INITIAL IMPRESSION / ASSESSMENT AND PLAN / ED COURSE  As part of my medical decision making, I reviewed the following data within the electronic MEDICAL RECORD NUMBER         Craig Sharp was evaluated in Emergency Department on 05/04/2019 for the symptoms described in the history of present illness. He was evaluated in the context of the global COVID-19 pandemic, which necessitated consideration that the patient might be at risk for infection with the SARS-CoV-2 virus that causes COVID-19. Institutional protocols and algorithms that pertain to the evaluation of patients at risk for COVID-19 are in a  state of rapid change based on information released by regulatory bodies including the CDC and federal and state organizations. These policies and algorithms were followed during the patient's care in the ED.  Patient presents with left shoulder pain secondary to a fall this morning.  Physical exam is remarkable for guarding at the [facility] joint.  There is also moderate guarding palpation the midshaft of the humerus.  Discussed x-ray finding patient presents with ligament injuries at the [facility] joint.  Patient placed in a sling and given a prescription for Lidoderm patches.  May continue previous medications.  Patient advised to follow orthopedic by calling for an appointment on Monday morning.   ____________________________________________   FINAL CLINICAL IMPRESSION(S) / ED DIAGNOSES  Final diagnoses:  Acute pain of left shoulder     ED Discharge Orders         Ordered    lidocaine (LIDODERM) 5 %  Every 12 hours     05/04/19 1110           Note:  This document was prepared using Dragon voice recognition software and may include unintentional dictation errors.    Joni Reining, PA-C 05/04/19 1119    Shaune Pollack, MD 05/04/19 2005

## 2019-05-04 NOTE — Discharge Instructions (Signed)
Wear sling and apply pain patches as directed.  Call orthopedic clinic in 2 days and tell you to follow-up in emergency room.  They will schedule point for definitive evaluation and treatment.

## 2019-10-22 IMAGING — CT CT ABD-PELV W/ CM
2 of 5 series · 17 of 46 positions shown, 19 images · IV contrast (APPLIED)
Comparison: 04/09/2011

CLINICAL DATA: Abdominal pain last couple weeks

EXAM:
CT ABDOMEN AND PELVIS WITH CONTRAST
TECHNIQUE: Multidetector CT imaging of the abdomen and pelvis was performed
using the standard protocol following bolus administration of
intravenous contrast.
CONTRAST:  100mL OMNIPAQUE IOHEXOL 350 MG/ML SOLN

[Series 2: routine abd/pel with · axial · 0.86mm/px · z∈[-378,+86]mm · 14 of 105 slices shown, 16 images]
[im 6/105  soft-tissue]
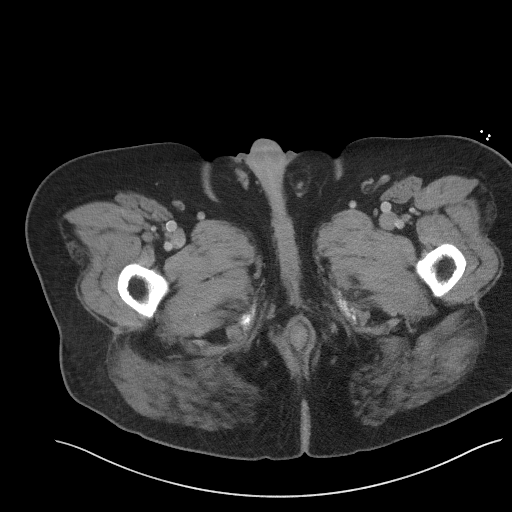
[im 6/105  bone]
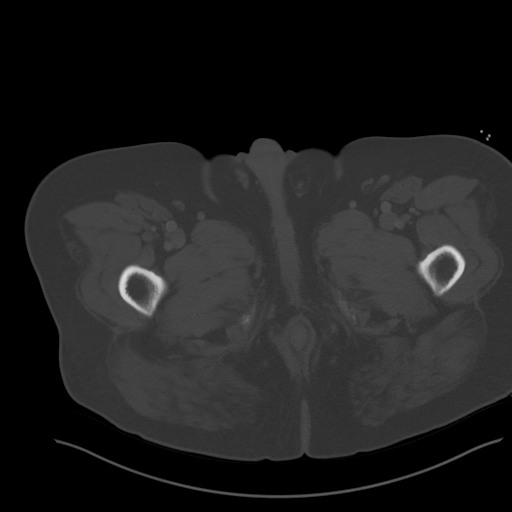
[im 11/105  soft-tissue]
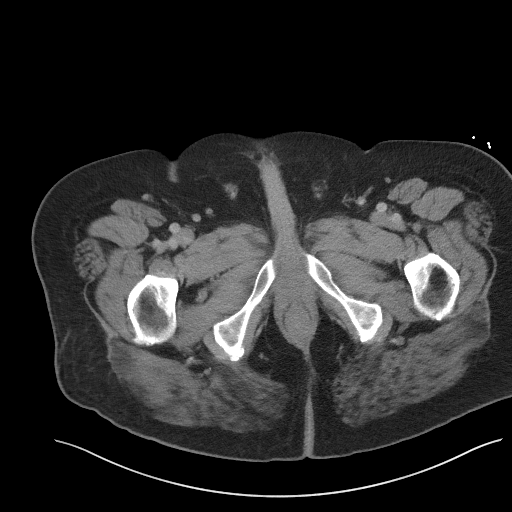
[im 22/105  soft-tissue]
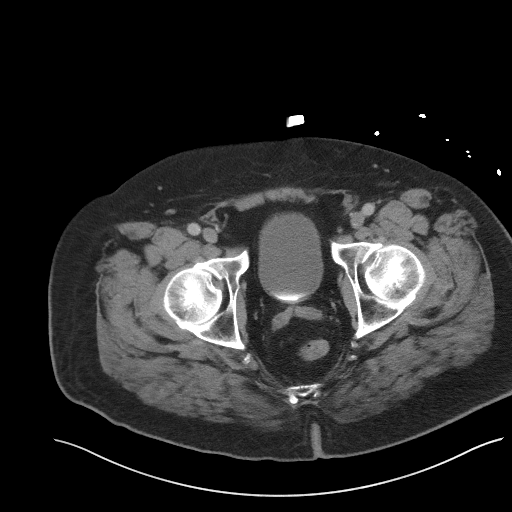
[im 28/105  soft-tissue]
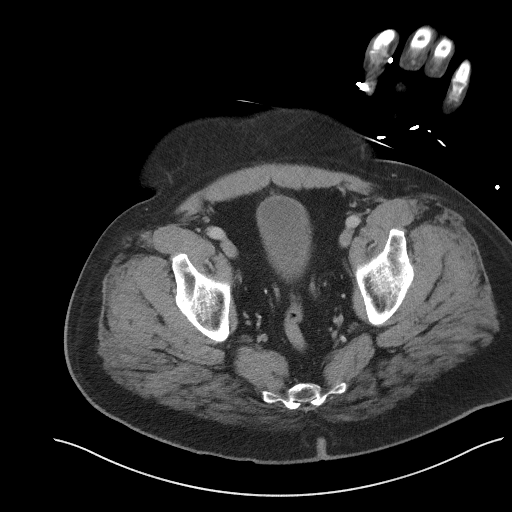
[im 33/105  soft-tissue]
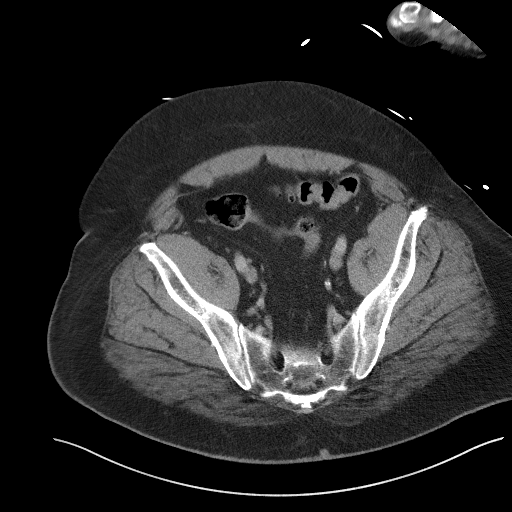
[im 44/105  soft-tissue]
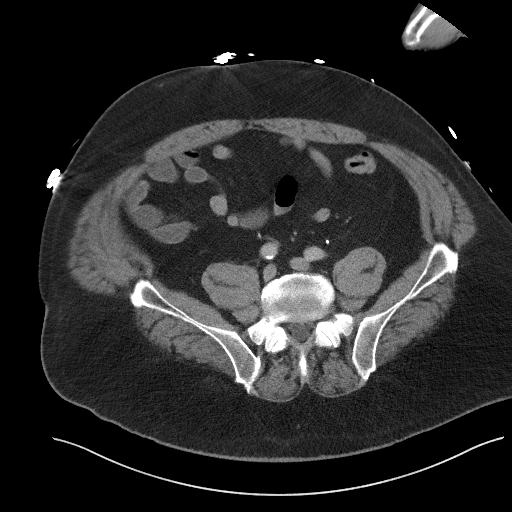
[im 50/105  soft-tissue]
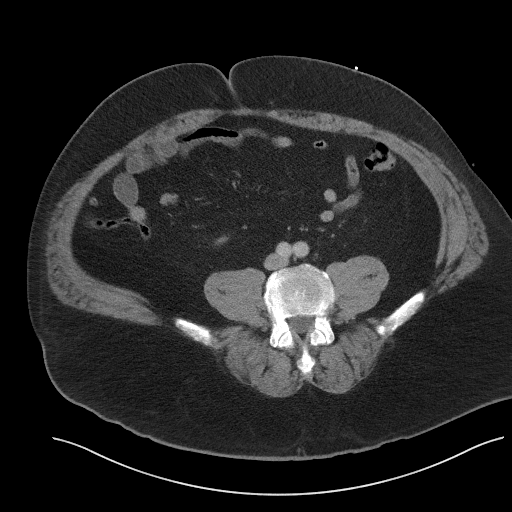
[im 55/105  soft-tissue]
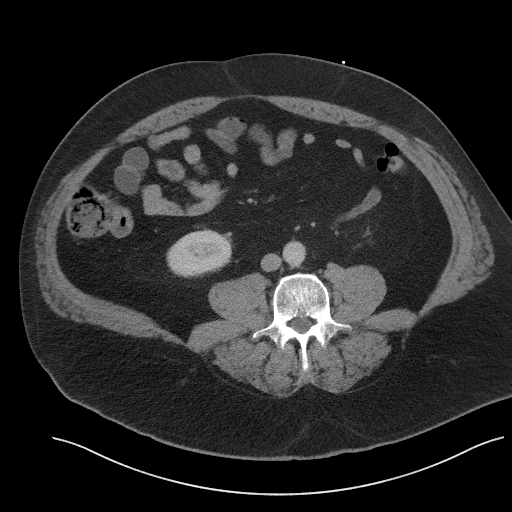
[im 61/105  soft-tissue]
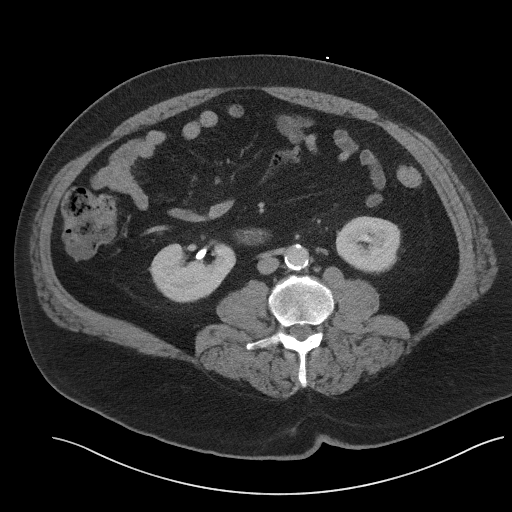
[im 61/105  bone]
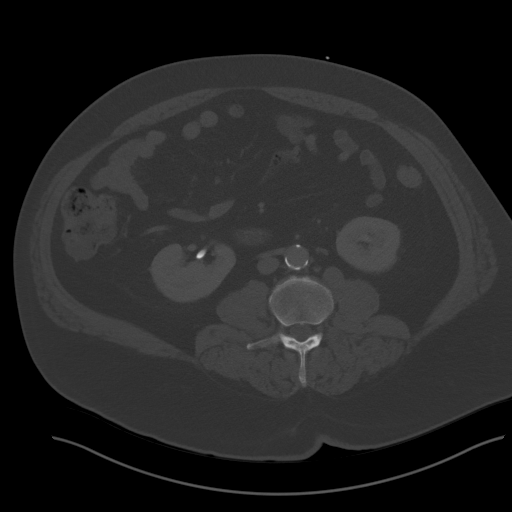
[im 72/105  soft-tissue]
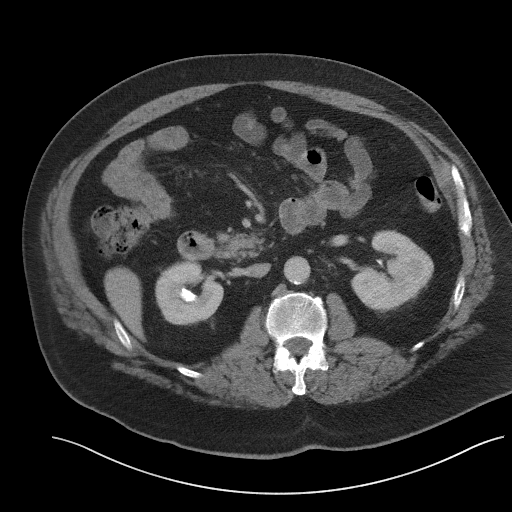
[im 77/105  soft-tissue]
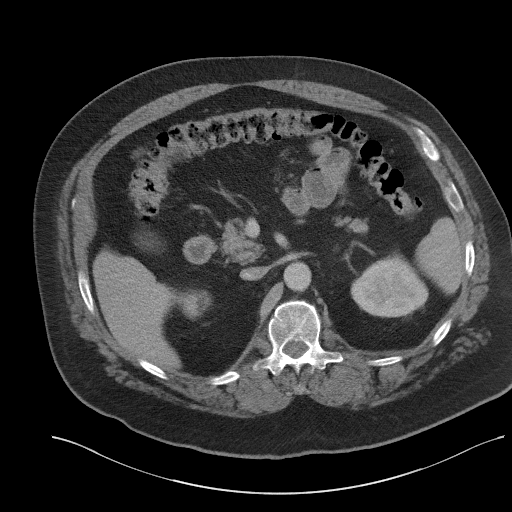
[im 83/105  soft-tissue]
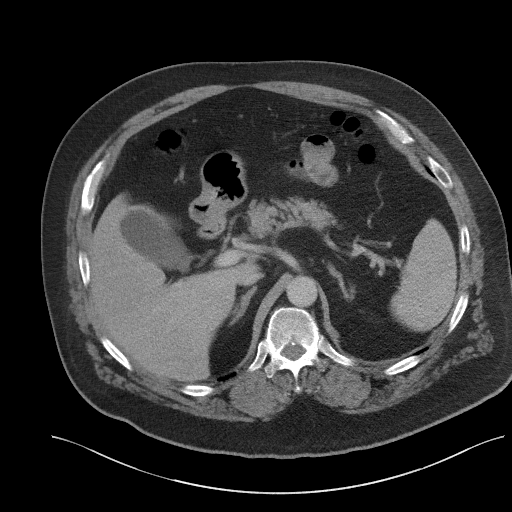
[im 94/105  soft-tissue]
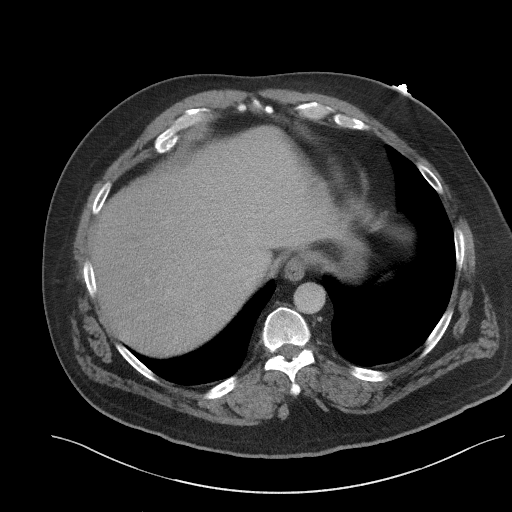
[im 99/105  soft-tissue]
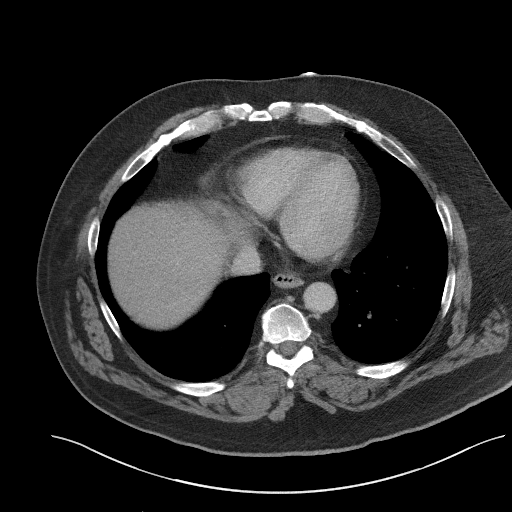

[Series 5: coronal st · coronal · 0.93mm/px · 3 of 120 slices shown]
[im 40/120  soft-tissue]
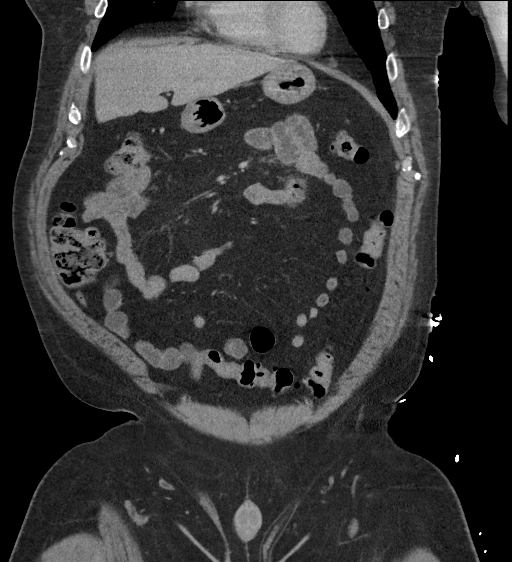
[im 53/120  soft-tissue]
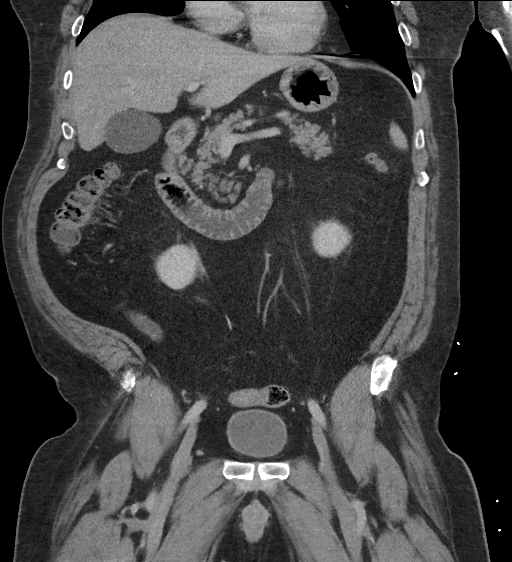
[im 67/120  soft-tissue]
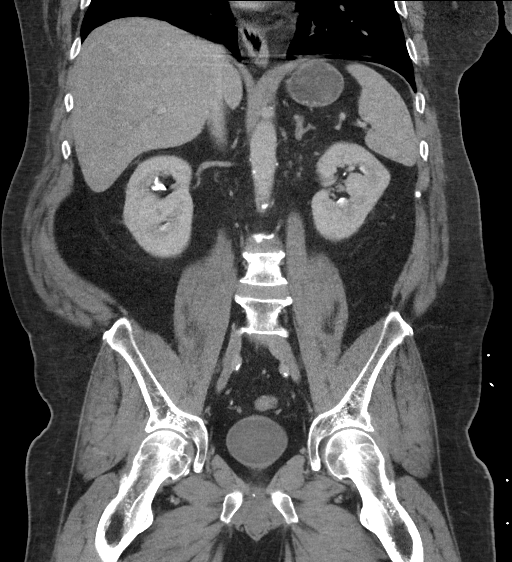

[17 of 46 positions shown; findings below may reference images not displayed]

FINDINGS: Lower chest: No acute abnormality.

Hepatobiliary: No focal liver abnormality is seen. No gallstones,
gallbladder wall thickening, or biliary dilatation.

Pancreas: Unremarkable. No pancreatic ductal dilatation or
surrounding inflammatory changes.

Spleen: Normal in size without focal abnormality.

Adrenals/Urinary Tract: Adrenal glands are unremarkable. Kidneys are
normal, without renal calculi, focal lesion, or hydronephrosis.
Bladder is unremarkable.

Stomach/Bowel: Stomach is within normal limits. Appendix appears
normal. No evidence of bowel wall thickening, distention, or
inflammatory changes.

Vascular/Lymphatic: Normal caliber abdominal aorta. No
lymphadenopathy. Abdominal aortic atherosclerosis.

Reproductive: Prostate is unremarkable.

Other: No abdominal wall hernia or abnormality. No abdominopelvic
ascites.

Musculoskeletal: No acute osseous abnormality. No aggressive osseous
lesion.
IMPRESSION: 1. No acute abdominal or pelvic pathology.

## 2020-07-17 IMAGING — CR DG SHOULDER 2+V*L*
1 series · 4 of 4 positions shown · non-contrast
Comparison: None.

CLINICAL DATA: Fell against the edge of the couch this morning,
hyper extending left shoulder. Now, unable to move/use left shoulder
Pt refused axillary condition

EXAM:
LEFT SHOULDER - 2+ VIEW

[Series 1: dg shoulder left · 0.14mm/px · 4 of 4 slices shown]
[im 1/4]
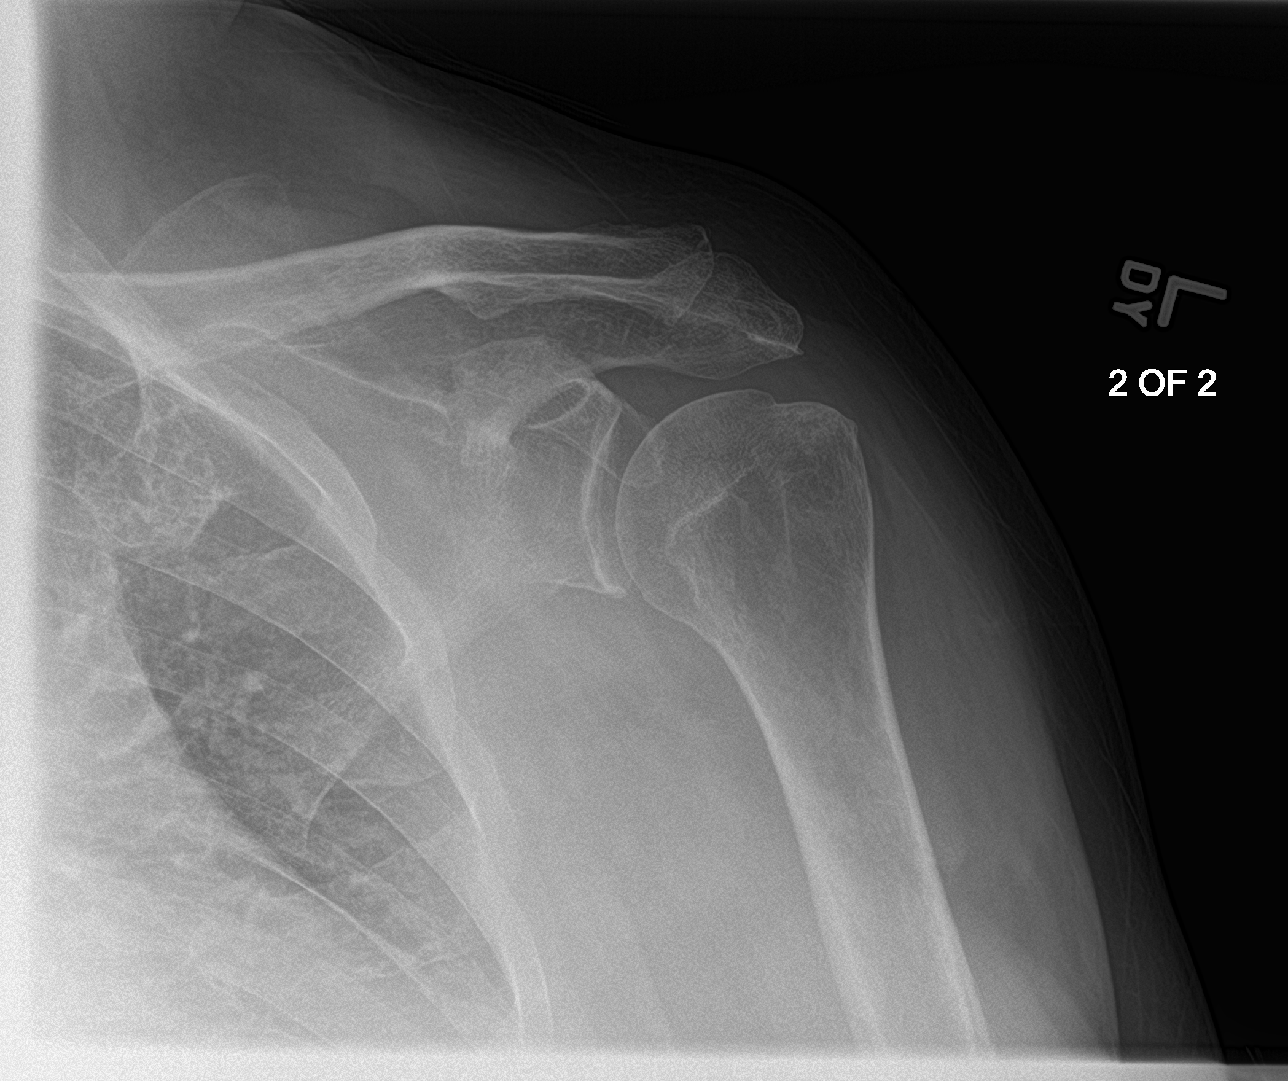
[im 2/4]
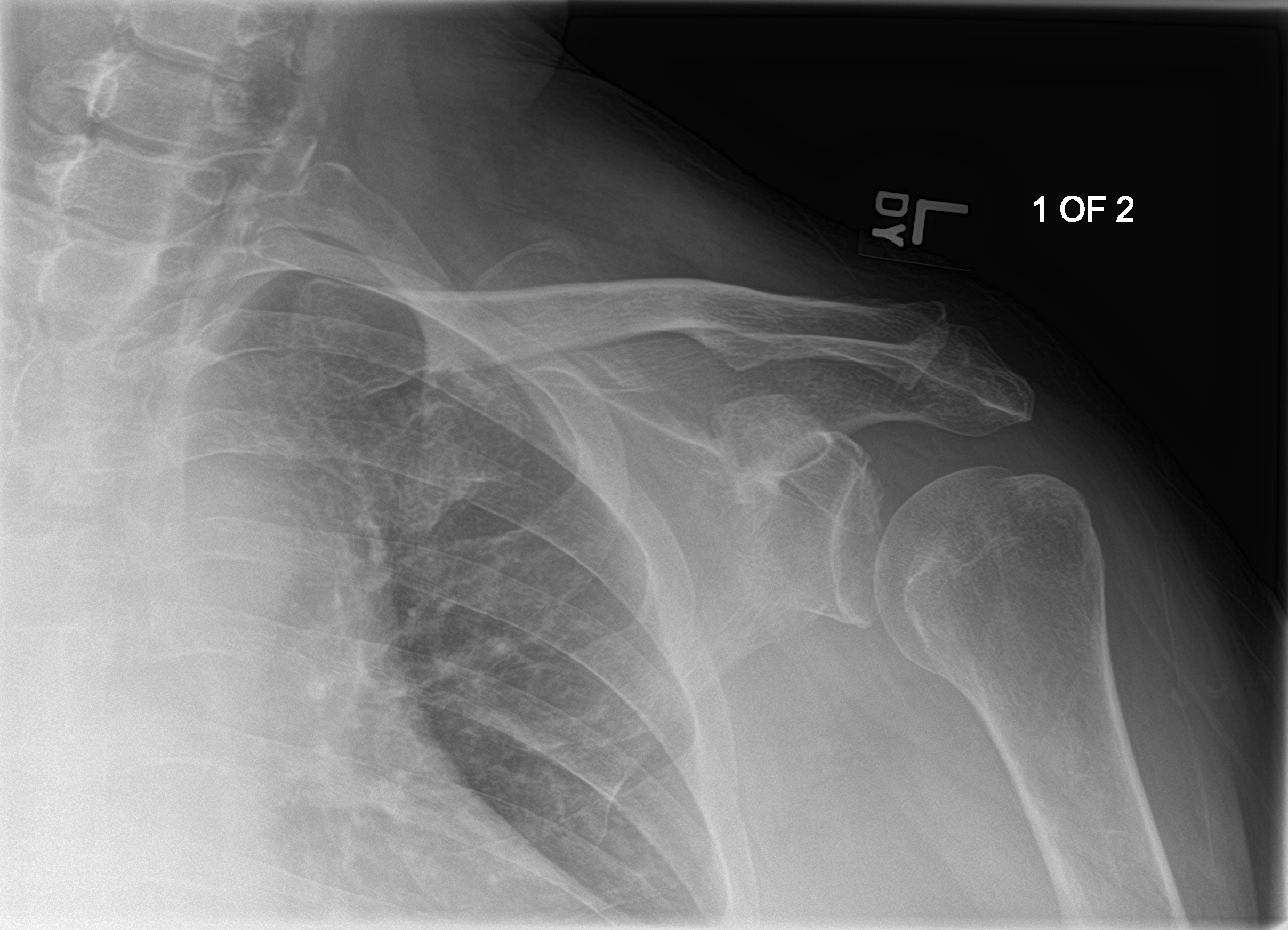
[im 3/4]
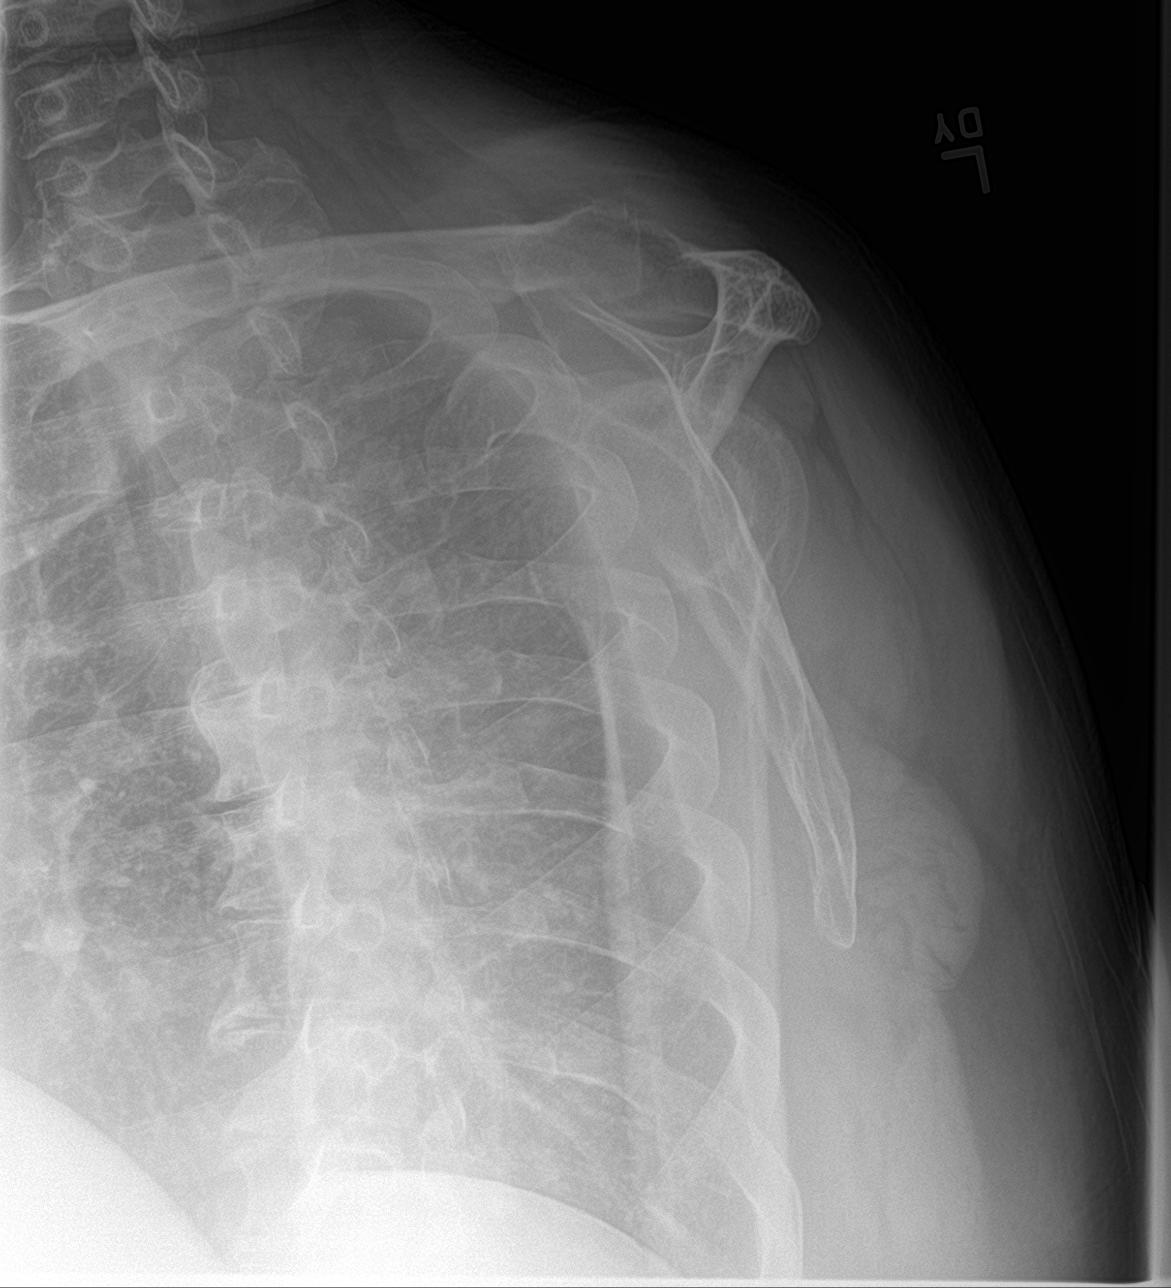
[im 4/4]
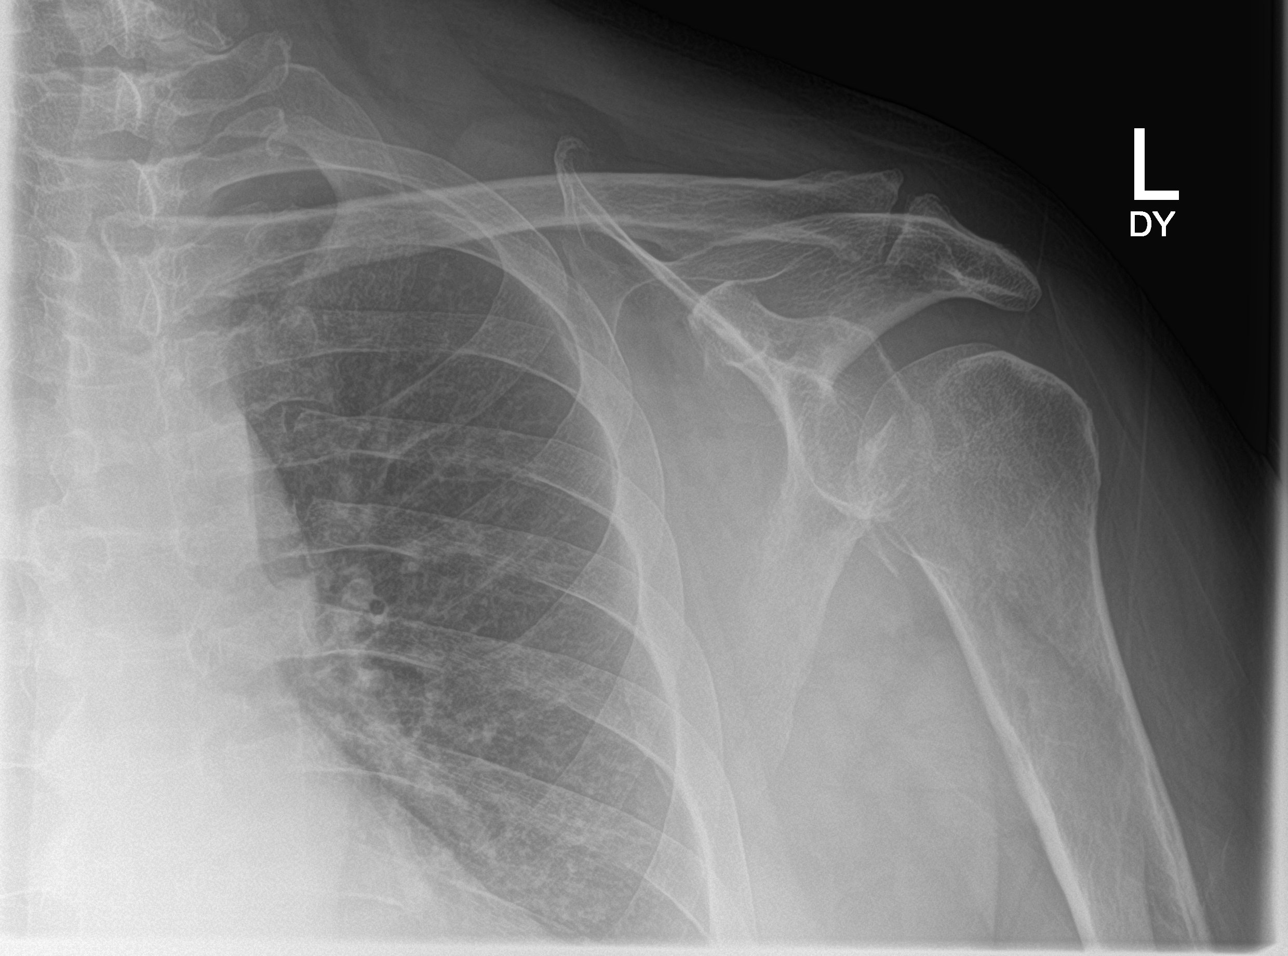

[4 of 4 positions shown; findings below may reference images not displayed]

FINDINGS: No fracture or bone lesion.

Glenohumeral joint appears widened, but normally aligned.

Narrowing of the AC joint with small marginal spurs consistent with
mild osteoarthritis.

Soft tissues are unremarkable.
IMPRESSION: 1. No fracture or dislocation.
2. Widened glenohumeral joint space suggesting ligamentous injury.
3. Mild AC joint osteoarthritis.
